# Patient Record
Sex: Male | Born: 1990 | Race: White | Hispanic: No | Marital: Single | Smoking: Never smoker
Health system: Southern US, Community
[De-identification: ages and names within clinical notes are randomized; demographics above are authoritative.]

## PROBLEM LIST (undated history)

## (undated) DIAGNOSIS — Z9889 Other specified postprocedural states: Secondary | ICD-10-CM

## (undated) DIAGNOSIS — R112 Nausea with vomiting, unspecified: Secondary | ICD-10-CM

## (undated) DIAGNOSIS — T300 Burn of unspecified body region, unspecified degree: Secondary | ICD-10-CM

## (undated) DIAGNOSIS — S42009A Fracture of unspecified part of unspecified clavicle, initial encounter for closed fracture: Secondary | ICD-10-CM

## (undated) DIAGNOSIS — S060XAA Concussion with loss of consciousness status unknown, initial encounter: Secondary | ICD-10-CM

## (undated) DIAGNOSIS — J189 Pneumonia, unspecified organism: Secondary | ICD-10-CM

## (undated) DIAGNOSIS — S060X9A Concussion with loss of consciousness of unspecified duration, initial encounter: Secondary | ICD-10-CM

## (undated) HISTORY — PX: WISDOM TOOTH EXTRACTION: SHX21

---

## 2014-06-25 ENCOUNTER — Ambulatory Visit (INDEPENDENT_AMBULATORY_CARE_PROVIDER_SITE_OTHER): Payer: Managed Care, Other (non HMO) | Admitting: Urgent Care

## 2014-06-25 VITALS — BP 96/60 | HR 52 | Temp 98.2°F | Resp 16 | Ht 69.0 in | Wt 132.2 lb

## 2014-06-25 DIAGNOSIS — J029 Acute pharyngitis, unspecified: Secondary | ICD-10-CM | POA: Diagnosis not present

## 2014-06-25 DIAGNOSIS — R0982 Postnasal drip: Secondary | ICD-10-CM | POA: Diagnosis not present

## 2014-06-25 LAB — POCT CBC
Granulocyte percent: 58.1 %G (ref 37–80)
HEMATOCRIT: 42.8 % — AB (ref 43.5–53.7)
HEMOGLOBIN: 14.5 g/dL (ref 14.1–18.1)
LYMPH, POC: 1.7 (ref 0.6–3.4)
MCH, POC: 31.4 pg — AB (ref 27–31.2)
MCHC: 33.9 g/dL (ref 31.8–35.4)
MCV: 92.7 fL (ref 80–97)
MID (cbc): 0.4 (ref 0–0.9)
MPV: 7.8 fL (ref 0–99.8)
POC Granulocyte: 3 (ref 2–6.9)
POC LYMPH PERCENT: 34.3 %L (ref 10–50)
POC MID %: 7.6 % (ref 0–12)
Platelet Count, POC: 191 10*3/uL (ref 142–424)
RBC: 4.62 M/uL — AB (ref 4.69–6.13)
RDW, POC: 14.6 %
WBC: 5.1 10*3/uL (ref 4.6–10.2)

## 2014-06-25 LAB — POCT RAPID STREP A (OFFICE): Rapid Strep A Screen: NEGATIVE

## 2014-06-25 NOTE — Progress Notes (Signed)
    MRN: [id number] DOB: 02/20/1991  Subjective:   Riley Hammond is a 24 y.o. male presenting for chief complaint of Sore Throat; sneezing; and Fatigue  Reports 1 day history of sore throat without cough, sneezing, left ear pressure. Denies fevers, itchy or watery red eyes, sinus pain, rhinorrhea, ear pain, ear drainage, chest pain, shob, wheezing, abdominal pain, n/v, diarrhea, myalgia. Also denies night sweats, weight loss, decreased appetite. Has had seasonal allergies in the past but has not needed to take any medications for this recently, denies history of asthma. Denies any other aggravating or relieving factors, no other questions or concerns.  Lyn Hollingsheadlexander has a current medication list which includes the following prescription(s): iron. He has No Known Allergies.  Lyn Hollingsheadlexander  has no past medical history on file. Also  has no past surgical history on file.  ROS As in subjective.  Objective:   Vitals: BP 96/60 mmHg  Pulse 52  Temp(Src) 98.2 F (36.8 C) (Oral)  Resp 16  Ht 5\' 9"  (1.753 m)  Wt 132 lb 3.2 oz (59.966 kg)  BMI 19.51 kg/m2  SpO2 100%  Physical Exam  Constitutional: He is well-developed, well-nourished, and in no distress.  HENT:  TM's intact bilaterally, no effusions or erythema. Nasal turbinates pink and moist with clear-yellow rhinorrhea. No sinus tenderness. Postnasal drip present, without oropharyngeal exudates, erythema or abscesses.  Eyes: Conjunctivae are normal. Right eye exhibits no discharge. Left eye exhibits no discharge. No scleral icterus.  Cardiovascular: Normal rate, regular rhythm and intact distal pulses.  Exam reveals no gallop and no friction rub.   No murmur heard. Pulmonary/Chest: No stridor. No respiratory distress. He has no wheezes. He has no rales. He exhibits no tenderness.  Lymphadenopathy:    He has cervical adenopathy (~1cm left sided anterior cervical node).   Results for orders placed or performed in visit on 06/25/14 (from  the past 24 hour(s))  POCT CBC     Status: Abnormal   Collection Time: 06/25/14  9:21 AM  Result Value Ref Range   WBC 5.1 4.6 - 10.2 K/uL   Lymph, poc 1.7 0.6 - 3.4   POC LYMPH PERCENT 34.3 10 - 50 %L   MID (cbc) 0.4 0 - 0.9   POC MID % 7.6 0 - 12 %M   POC Granulocyte 3.0 2 - 6.9   Granulocyte percent 58.1 37 - 80 %G   RBC 4.62 (A) 4.69 - 6.13 M/uL   Hemoglobin 14.5 14.1 - 18.1 g/dL   HCT, POC 13.042.8 (A) 86.543.5 - 53.7 %   MCV 92.7 80 - 97 fL   MCH, POC 31.4 (A) 27 - 31.2 pg   MCHC 33.9 31.8 - 35.4 g/dL   RDW, POC 78.414.6 %   Platelet Count, POC 191 142 - 424 K/uL   MPV 7.8 0 - 99.8 fL  POCT rapid strep A     Status: None   Collection Time: 06/25/14  9:23 AM  Result Value Ref Range   Rapid Strep A Screen Negative Negative   Assessment and Plan :   1. Sore throat 2. Postnasal drip - Labs pending, unlikely due to infectious process given physical exam findings and cbc. May be due to seasonal allergies, advise he restart Flonase, start Zyrtec, take Delsym over the counter, continue to hydrate well. - Return to clinic in 1 if symptoms have not resolved or have worsened.  Wallis BambergMario Teesha Ohm, PA-C [facility] [phone] 06/25/2014 8:58 AM

## 2014-06-27 LAB — CULTURE, GROUP A STREP: ORGANISM ID, BACTERIA: NORMAL

## 2015-05-09 ENCOUNTER — Encounter (HOSPITAL_COMMUNITY): Payer: Self-pay

## 2015-05-09 ENCOUNTER — Emergency Department (HOSPITAL_COMMUNITY)
Admission: EM | Admit: 2015-05-09 | Discharge: 2015-05-09 | Disposition: A | Discharge status: Home / Self Care | Payer: Managed Care, Other (non HMO) | Attending: Emergency Medicine | Admitting: Emergency Medicine

## 2015-05-09 DIAGNOSIS — Y999 Unspecified external cause status: Secondary | ICD-10-CM | POA: Insufficient documentation

## 2015-05-09 DIAGNOSIS — S99911A Unspecified injury of right ankle, initial encounter: Secondary | ICD-10-CM | POA: Diagnosis present

## 2015-05-09 DIAGNOSIS — Y92410 Unspecified street and highway as the place of occurrence of the external cause: Secondary | ICD-10-CM | POA: Diagnosis not present

## 2015-05-09 DIAGNOSIS — S91011A Laceration without foreign body, right ankle, initial encounter: Secondary | ICD-10-CM | POA: Diagnosis not present

## 2015-05-09 DIAGNOSIS — Y9389 Activity, other specified: Secondary | ICD-10-CM | POA: Insufficient documentation

## 2015-05-09 DIAGNOSIS — T148 Other injury of unspecified body region: Secondary | ICD-10-CM | POA: Insufficient documentation
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
## 2015-05-12 ENCOUNTER — Ambulatory Visit (INDEPENDENT_AMBULATORY_CARE_PROVIDER_SITE_OTHER): Payer: Self-pay

## 2015-05-12 ENCOUNTER — Ambulatory Visit (INDEPENDENT_AMBULATORY_CARE_PROVIDER_SITE_OTHER): Payer: Self-pay | Admitting: Physician Assistant

## 2015-05-12 VITALS — BP 110/62 | HR 50 | Temp 98.5°F | Resp 16 | Ht 68.5 in | Wt 144.2 lb

## 2015-05-12 DIAGNOSIS — T148XXA Other injury of unspecified body region, initial encounter: Secondary | ICD-10-CM

## 2015-05-12 DIAGNOSIS — S42022A Displaced fracture of shaft of left clavicle, initial encounter for closed fracture: Secondary | ICD-10-CM

## 2015-05-12 DIAGNOSIS — M25512 Pain in left shoulder: Secondary | ICD-10-CM

## 2015-05-12 MED ORDER — SILVER SULFADIAZINE 1 % EX CREA: 1.0000 "application " | TOPICAL_CREAM | Freq: Every day | CUTANEOUS | Status: AC
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
## 2015-05-12 NOTE — Patient Instructions (Addendum)
Because you received an x-ray today, you will receive an invoice from [facility]. Please contact [facility] at [phone] with questions or concerns regarding your invoice. Our billing staff will not be able to assist you with those questions. Please place the silvadene changes daily.   I would like you to await contact for [facility].

## 2015-05-14 ENCOUNTER — Other Ambulatory Visit: Payer: Self-pay | Admitting: Orthopedic Surgery

## 2015-05-18 ENCOUNTER — Encounter (HOSPITAL_COMMUNITY)
Admission: RE | Admit: 2015-05-18 | Discharge: 2015-05-18 | Disposition: A | Discharge status: Home / Self Care | Payer: Managed Care, Other (non HMO) | Source: Ambulatory Visit | Attending: Orthopedic Surgery | Admitting: Orthopedic Surgery

## 2015-05-18 ENCOUNTER — Encounter (HOSPITAL_COMMUNITY): Payer: Self-pay

## 2015-05-18 ENCOUNTER — Inpatient Hospital Stay (HOSPITAL_COMMUNITY): Admission: RE | Admit: 2015-05-18 | Payer: Self-pay | Source: Ambulatory Visit

## 2015-05-18 DIAGNOSIS — S4382XA Sprain of other specified parts of left shoulder girdle, initial encounter: Secondary | ICD-10-CM | POA: Diagnosis not present

## 2015-05-18 DIAGNOSIS — S42002A Fracture of unspecified part of left clavicle, initial encounter for closed fracture: Secondary | ICD-10-CM | POA: Insufficient documentation

## 2015-05-18 DIAGNOSIS — X58XXXA Exposure to other specified factors, initial encounter: Secondary | ICD-10-CM | POA: Insufficient documentation

## 2015-05-18 DIAGNOSIS — Z01812 Encounter for preprocedural laboratory examination: Secondary | ICD-10-CM | POA: Diagnosis not present

## 2015-05-18 HISTORY — DX: Fracture of unspecified part of unspecified clavicle, initial encounter for closed fracture: S42.009A

## 2015-05-18 HISTORY — DX: Concussion with loss of consciousness status unknown, initial encounter: S06.0XAA

## 2015-05-18 HISTORY — DX: Nausea with vomiting, unspecified: R11.2

## 2015-05-18 HISTORY — DX: Concussion with loss of consciousness of unspecified duration, initial encounter: S06.0X9A

## 2015-05-18 HISTORY — DX: Pneumonia, unspecified organism: J18.9

## 2015-05-18 HISTORY — DX: Burn of unspecified body region, unspecified degree: T30.0

## 2015-05-18 HISTORY — DX: Other specified postprocedural states: Z98.890

## 2015-05-18 LAB — CBC
HEMATOCRIT: 39.3 % (ref 39.0–52.0)
HEMOGLOBIN: 13.3 g/dL (ref 13.0–17.0)
MCH: 32 pg (ref 26.0–34.0)
MCHC: 33.8 g/dL (ref 30.0–36.0)
MCV: 94.5 fL (ref 78.0–100.0)
Platelets: 215 10*3/uL (ref 150–400)
RBC: 4.16 MIL/uL — AB (ref 4.22–5.81)
RDW: 14.3 % (ref 11.5–15.5)
WBC: 7 10*3/uL (ref 4.0–10.5)

## 2015-05-18 MED ORDER — CEFAZOLIN SODIUM-DEXTROSE 2-3 GM-% IV SOLR
2.0000 g | INTRAVENOUS | Status: AC
  Administered 2015-05-19: 2 g via INTRAVENOUS
  Filled 2015-05-18: qty 50

## 2015-05-18 NOTE — Progress Notes (Signed)
Pt denies SOB, chest pain, and being under the care of a cardiologist. Pt denies having a stress test, echo and cardiac cath. Pt denies having an EKG within the last year. 
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
## 2015-05-18 NOTE — Pre-Procedure Instructions (Signed)
Lyn Hollingshead [facility]  05/18/2015      [facility] DRUG STORE [address] [address], [place] - [address] AT [facility] OF [facility] & [address] [place] [address] Phone: [phone] Fax: [phone]  [facility] DR[facility] STORE [address] [address], [place] - [address] AT [facility] OF [address] & [address] [place] [address] Phone: [phone] Fax: [phone]    Your procedure is scheduled on Tuesday, May 19, 2015  Report to [facility] Admitting at 10:00 A.M.  Call this number if you have problems the morning of surgery:  [phone]   Remember:  Do not eat food or drink liquids after midnight.  Take these medicines the morning of surgery with A SIP OF WATER : None Stop taking Aspirin,vitamins, fish oil, and herbal medications. Do not take any NSAIDs ie: Ibuprofen, Advil, Naproxen, BC and Goody Powder or any medication containing Aspirin; stop now.  Do not wear jewelry, make-up or nail polish.  Do not wear lotions, powders, or perfumes.  You may not wear deodorant.  Do not shave 48 hours prior to surgery.  Men may shave face and neck.  Do not bring valuables to the hospital.  [facility] is not responsible for any belongings or valuables.  Contacts, dentures or bridgework may not be worn into surgery.  Leave your suitcase in the car.  After surgery it may be brought to your room.  For patients admitted to the hospital, discharge time will be determined by your treatment team.  Patients discharged the day of surgery will not be allowed to drive home.   Name and phone number of your driver:   Special instructions:  [facility] - Preparing for Surgery  Before surgery, you can play an important role.  Because skin is not sterile, your skin needs to be as free of germs as possible.  You can reduce the number of germs on you skin by washing with CHG (chlorahexidine gluconate) soap before surgery.  CHG is an antiseptic  cleaner which kills germs and bonds with the skin to continue killing germs even after washing.  Please DO NOT use if you have an allergy to CHG or antibacterial soaps.  If your skin becomes reddened/irritated stop using the CHG and inform your nurse when you arrive at Short Stay.  Do not shave (including legs and underarms) for at least 48 hours prior to the first CHG shower.  You may shave your face.  Please follow these instructions carefully:   1.  Shower with CHG Soap the night before surgery and the morning of Surgery.  2.  If you choose to wash your hair, wash your hair first as usual with your normal shampoo.  3.  After you shampoo, rinse your hair and body thoroughly to remove the Shampoo.  4.  Use CHG as you would any other liquid soap.  You can apply chg directly  to the skin and wash gently with scrungie or a clean washcloth.  5.  Apply the CHG Soap to your body ONLY FROM THE NECK DOWN.  Do not use on open wounds or open sores.  Avoid contact with your eyes, ears, mouth and genitals (private parts).  Wash genitals (private parts) with your normal soap.  6.  Wash thoroughly, paying special attention to the area where your surgery will be performed.  7.  Thoroughly rinse your body with warm water from the neck down.  8.  DO NOT shower/wash with your normal soap after using and rinsing off the CHG Soap.  9.  Pat yourself dry with a clean towel.            10.  Wear clean pajamas.            11.  Place clean sheets on your bed the night of your first shower and do not sleep with pets.  Day of Surgery  Do not apply any lotions/deodorants the morning of surgery.  Please wear clean clothes to the hospital/surgery center.  Please read over the following fact sheets that you were given. Pain Booklet, Coughing and Deep Breathing and Surgical Site Infection Prevention

## 2015-05-19 ENCOUNTER — Encounter (HOSPITAL_COMMUNITY): Payer: Self-pay | Admitting: Anesthesiology

## 2015-05-19 ENCOUNTER — Encounter (HOSPITAL_COMMUNITY)
Admission: RE | Disposition: A | Discharge status: Home / Self Care | Payer: Self-pay | Source: Ambulatory Visit | Attending: Orthopedic Surgery

## 2015-05-19 ENCOUNTER — Ambulatory Visit (HOSPITAL_COMMUNITY): Payer: Managed Care, Other (non HMO) | Admitting: Anesthesiology

## 2015-05-19 ENCOUNTER — Ambulatory Visit (HOSPITAL_COMMUNITY): Payer: Managed Care, Other (non HMO)

## 2015-05-19 ENCOUNTER — Ambulatory Visit (HOSPITAL_COMMUNITY)
Admission: RE | Admit: 2015-05-19 | Discharge: 2015-05-19 | Disposition: A | Discharge status: Home / Self Care | Payer: Managed Care, Other (non HMO) | Source: Ambulatory Visit | Attending: Orthopedic Surgery | Admitting: Orthopedic Surgery

## 2015-05-19 DIAGNOSIS — X58XXXA Exposure to other specified factors, initial encounter: Secondary | ICD-10-CM | POA: Diagnosis not present

## 2015-05-19 DIAGNOSIS — Z419 Encounter for procedure for purposes other than remedying health state, unspecified: Secondary | ICD-10-CM

## 2015-05-19 DIAGNOSIS — S42002A Fracture of unspecified part of left clavicle, initial encounter for closed fracture: Secondary | ICD-10-CM | POA: Diagnosis not present

## 2015-05-19 DIAGNOSIS — S4382XA Sprain of other specified parts of left shoulder girdle, initial encounter: Secondary | ICD-10-CM | POA: Insufficient documentation

## 2015-05-19 DIAGNOSIS — S80812A Abrasion, left lower leg, initial encounter: Secondary | ICD-10-CM | POA: Insufficient documentation

## 2015-05-19 DIAGNOSIS — S80811A Abrasion, right lower leg, initial encounter: Secondary | ICD-10-CM | POA: Diagnosis not present

## 2015-05-19 HISTORY — PX: ORIF CLAVICULAR FRACTURE: SHX5055

## 2015-05-19 SURGERY — OPEN REDUCTION INTERNAL FIXATION (ORIF) CLAVICULAR FRACTURE
Anesthesia: Regional | Site: Shoulder | Laterality: Left

## 2015-05-19 MED ORDER — GLYCOPYRROLATE 0.2 MG/ML IJ SOLN
INTRAMUSCULAR | Status: AC
  Filled 2015-05-19: qty 4

## 2015-05-19 MED ORDER — NEOSTIGMINE METHYLSULFATE 10 MG/10ML IV SOLN
INTRAVENOUS | Status: AC
  Filled 2015-05-19: qty 1

## 2015-05-19 MED ORDER — EPHEDRINE SULFATE 50 MG/ML IJ SOLN
INTRAMUSCULAR | Status: DC | PRN
  Administered 2015-05-19: 10 mg via INTRAVENOUS
  Administered 2015-05-19 (×3): 5 mg via INTRAVENOUS

## 2015-05-19 MED ORDER — PROPOFOL 10 MG/ML IV BOLUS
INTRAVENOUS | Status: AC
  Filled 2015-05-19: qty 20

## 2015-05-19 MED ORDER — MIDAZOLAM HCL 5 MG/ML IJ SOLN: 2.0000 mg | Freq: Once | INTRAMUSCULAR | Status: DC

## 2015-05-19 MED ORDER — PROPOFOL 10 MG/ML IV BOLUS
INTRAVENOUS | Status: DC | PRN
  Administered 2015-05-19: 180 mg via INTRAVENOUS

## 2015-05-19 MED ORDER — ROCURONIUM BROMIDE 50 MG/5ML IV SOLN
INTRAVENOUS | Status: AC
  Filled 2015-05-19: qty 2

## 2015-05-19 MED ORDER — METOCLOPRAMIDE HCL 5 MG/ML IJ SOLN: 10.0000 mg | Freq: Once | INTRAMUSCULAR | Status: DC | PRN

## 2015-05-19 MED ORDER — CHLORHEXIDINE GLUCONATE 4 % EX LIQD: 60.0000 mL | Freq: Once | CUTANEOUS | Status: DC

## 2015-05-19 MED ORDER — LIDOCAINE HCL (CARDIAC) 20 MG/ML IV SOLN
INTRAVENOUS | Status: DC | PRN
  Administered 2015-05-19: 60 mg via INTRAVENOUS

## 2015-05-19 MED ORDER — BUPIVACAINE HCL (PF) 0.25 % IJ SOLN
INTRAMUSCULAR | Status: AC
  Filled 2015-05-19: qty 30

## 2015-05-19 MED ORDER — LACTATED RINGERS IV SOLN
INTRAVENOUS | Status: DC
  Administered 2015-05-19 (×2): via INTRAVENOUS

## 2015-05-19 MED ORDER — ONDANSETRON HCL 4 MG/2ML IJ SOLN
INTRAMUSCULAR | Status: DC | PRN
  Administered 2015-05-19: 4 mg via INTRAVENOUS

## 2015-05-19 MED ORDER — MIDAZOLAM HCL 2 MG/2ML IJ SOLN
INTRAMUSCULAR | Status: AC
  Filled 2015-05-19: qty 2

## 2015-05-19 MED ORDER — SUGAMMADEX SODIUM 200 MG/2ML IV SOLN
INTRAVENOUS | Status: DC | PRN
  Administered 2015-05-19: 134.2 mg via INTRAVENOUS

## 2015-05-19 MED ORDER — BUPIVACAINE-EPINEPHRINE (PF) 0.5% -1:200000 IJ SOLN
INTRAMUSCULAR | Status: DC | PRN
  Administered 2015-05-19: 30 mL via PERINEURAL

## 2015-05-19 MED ORDER — EPHEDRINE SULFATE 50 MG/ML IJ SOLN
INTRAMUSCULAR | Status: AC
  Filled 2015-05-19: qty 1

## 2015-05-19 MED ORDER — HYDROMORPHONE HCL 1 MG/ML IJ SOLN: 0.2500 mg | INTRAMUSCULAR | Status: DC | PRN

## 2015-05-19 MED ORDER — METHOCARBAMOL 500 MG PO TABS: 500.0000 mg | ORAL_TABLET | Freq: Three times a day (TID) | ORAL | Status: AC | PRN

## 2015-05-19 MED ORDER — SUGAMMADEX SODIUM 200 MG/2ML IV SOLN
INTRAVENOUS | Status: AC
  Filled 2015-05-19: qty 2

## 2015-05-19 MED ORDER — ONDANSETRON HCL 4 MG/2ML IJ SOLN
INTRAMUSCULAR | Status: AC
  Filled 2015-05-19: qty 2

## 2015-05-19 MED ORDER — ROCURONIUM BROMIDE 100 MG/10ML IV SOLN
INTRAVENOUS | Status: DC | PRN
  Administered 2015-05-19: 10 mg via INTRAVENOUS
  Administered 2015-05-19: 40 mg via INTRAVENOUS
  Administered 2015-05-19: 10 mg via INTRAVENOUS

## 2015-05-19 MED ORDER — OXYCODONE-ACETAMINOPHEN 5-325 MG PO TABS: 1.0000 | ORAL_TABLET | Freq: Four times a day (QID) | ORAL | Status: AC | PRN

## 2015-05-19 MED ORDER — SUCCINYLCHOLINE CHLORIDE 20 MG/ML IJ SOLN
INTRAMUSCULAR | Status: AC
  Filled 2015-05-19: qty 1

## 2015-05-19 MED ORDER — MIDAZOLAM HCL 2 MG/2ML IJ SOLN
INTRAMUSCULAR | Status: AC
  Administered 2015-05-19: 2 mg
  Filled 2015-05-19: qty 2

## 2015-05-19 MED ORDER — FENTANYL CITRATE (PF) 100 MCG/2ML IJ SOLN
INTRAMUSCULAR | Status: DC | PRN
Start: 2015-05-19 — End: 2015-05-19
  Administered 2015-05-19 (×2): 50 ug via INTRAVENOUS

## 2015-05-19 MED ORDER — FENTANYL CITRATE (PF) 250 MCG/5ML IJ SOLN
INTRAMUSCULAR | Status: AC
  Filled 2015-05-19: qty 5

## 2015-05-19 MED ORDER — MEPERIDINE HCL 25 MG/ML IJ SOLN: 6.2500 mg | INTRAMUSCULAR | Status: DC | PRN

## 2015-05-19 MED ORDER — FENTANYL CITRATE (PF) 100 MCG/2ML IJ SOLN
INTRAMUSCULAR | Status: AC
  Administered 2015-05-19: 100 ug
  Filled 2015-05-19: qty 2

## 2015-05-19 MED ORDER — 0.9 % SODIUM CHLORIDE (POUR BTL) OPTIME
TOPICAL | Status: DC | PRN
  Administered 2015-05-19 (×3): 1000 mL

## 2015-05-19 MED ORDER — FENTANYL CITRATE (PF) 100 MCG/2ML IJ SOLN: 100.0000 ug | Freq: Once | INTRAMUSCULAR | Status: DC

## 2015-05-19 MED ORDER — ARTIFICIAL TEARS OP OINT
TOPICAL_OINTMENT | OPHTHALMIC | Status: AC
  Filled 2015-05-19: qty 3.5

## 2015-05-19 MED ORDER — LIDOCAINE HCL (CARDIAC) 20 MG/ML IV SOLN
INTRAVENOUS | Status: AC
  Filled 2015-05-19: qty 5

## 2015-05-19 SURGICAL SUPPLY — 65 items
BENZOIN TINCTURE PRP APPL 2/3 (GAUZE/BANDAGES/DRESSINGS) ×3 IMPLANT
BIT DRILL 2 CANN GRADUATED (BIT) ×3 IMPLANT
BIT DRILL 2.5 CANN ENDOSCOPIC (BIT) ×3 IMPLANT
BIT DRILL CANNULTD 2.4MMAC REP (DRILL) ×1 IMPLANT
BUTTON CLAVICLE (Orthopedic Implant) ×2 IMPLANT
BUTTON DOGBONE (Orthopedic Implant) ×3 IMPLANT
CLOSURE STERI-STRIP 1/2X4 (GAUZE/BANDAGES/DRESSINGS) ×1
CLSR STERI-STRIP ANTIMIC 1/2X4 (GAUZE/BANDAGES/DRESSINGS) ×2 IMPLANT
COVER SURGICAL LIGHT HANDLE (MISCELLANEOUS) ×3 IMPLANT
DRAPE C-ARM 42X72 X-RAY (DRAPES) ×3 IMPLANT
DRAPE IMP U-DRAPE 54X76 (DRAPES) ×3 IMPLANT
DRAPE INCISE IOBAN 66X45 STRL (DRAPES) ×6 IMPLANT
DRAPE U-SHAPE 47X51 STRL (DRAPES) ×6 IMPLANT
DRILL CANNULATED 2.4MM AC REP (DRILL) ×3
DRSG AQUACEL AG ADV 3.5X10 (GAUZE/BANDAGES/DRESSINGS) ×3 IMPLANT
DURAPREP 26ML APPLICATOR (WOUND CARE) ×3 IMPLANT
ELECT REM PT RETURN 9FT ADLT (ELECTROSURGICAL) ×3
ELECTRODE REM PT RTRN 9FT ADLT (ELECTROSURGICAL) ×1 IMPLANT
FIBERSTICK 2 (SUTURE) ×3 IMPLANT
GLOVE BIO SURGEON ST LM GN SZ9 (GLOVE) ×3 IMPLANT
GLOVE BIOGEL PI IND STRL 6.5 (GLOVE) ×1 IMPLANT
GLOVE BIOGEL PI IND STRL 8 (GLOVE) ×1 IMPLANT
GLOVE BIOGEL PI INDICATOR 6.5 (GLOVE) ×2
GLOVE BIOGEL PI INDICATOR 8 (GLOVE) ×2
GLOVE SURG ORTHO 8.0 STRL STRW (GLOVE) ×6 IMPLANT
GLOVE SURG SS PI 6.5 STRL IVOR (GLOVE) ×9 IMPLANT
GOWN STRL REUS W/ TWL LRG LVL3 (GOWN DISPOSABLE) ×2 IMPLANT
GOWN STRL REUS W/ TWL XL LVL3 (GOWN DISPOSABLE) ×1 IMPLANT
GOWN STRL REUS W/TWL LRG LVL3 (GOWN DISPOSABLE) ×4
GOWN STRL REUS W/TWL XL LVL3 (GOWN DISPOSABLE) ×2
K-WIRE BB-TAK (WIRE) ×3
KIT BASIN OR (CUSTOM PROCEDURE TRAY) ×3 IMPLANT
KIT ROOM TURNOVER OR (KITS) ×3 IMPLANT
KWIRE BB-TAK (WIRE) ×1 IMPLANT
LASSO CRESCENT QUICKPASS (SUTURE) ×3 IMPLANT
LOOP FIBERTAPE 2MM BLUE (VASCULAR PRODUCTS) ×3 IMPLANT
MANIFOLD NEPTUNE II (INSTRUMENTS) ×3 IMPLANT
NEEDLE 1/2 CIR CATGUT .05X1.09 (NEEDLE) ×3 IMPLANT
NEEDLE HYPO 25GX1X1/2 BEV (NEEDLE) ×9 IMPLANT
NS IRRIG 1000ML POUR BTL (IV SOLUTION) ×9 IMPLANT
PACK SHOULDER (CUSTOM PROCEDURE TRAY) ×3 IMPLANT
PACK UNIVERSAL I (CUSTOM PROCEDURE TRAY) ×3 IMPLANT
PAD ARMBOARD 7.5X6 YLW CONV (MISCELLANEOUS) ×3 IMPLANT
PLATE 5H DISTAL THIRD (Plate) ×3 IMPLANT
RETRIEVER SUT HEWSON (MISCELLANEOUS) ×3 IMPLANT
SCREW LOCK T10 FT 18X2.7X (Screw) ×1 IMPLANT
SCREW LOCKING 2.7X16MM (Screw) ×6 IMPLANT
SCREW LOCKING 2.7X18MM (Screw) ×2 IMPLANT
SCREW LOCKING LP 2.7X20MM (Screw) ×6 IMPLANT
SCREW LOW PROFILE 3.5X16 (Screw) ×3 IMPLANT
SCREW NLOCK 26X2.7XLOPRFL (Screw) ×1 IMPLANT
SCREW NLOCK T15 FT 18X3.5XST (Screw) ×2 IMPLANT
SCREW NON LOCK 3.5X18MM (Screw) ×4 IMPLANT
SCREW NON-LOCKING 3.5X20 ANKLE (Screw) ×3 IMPLANT
SCREW NONLOCK 2.7X26MM (Screw) ×2 IMPLANT
SPONGE LAP 4X18 X RAY DECT (DISPOSABLE) ×15 IMPLANT
SUCTION FRAZIER HANDLE 10FR (MISCELLANEOUS) ×2
SUCTION TUBE FRAZIER 10FR DISP (MISCELLANEOUS) ×1 IMPLANT
SUT ETHILON 3 0 PS 1 (SUTURE) ×3 IMPLANT
SUT VIC AB 0 CT1 27 (SUTURE) ×4
SUT VIC AB 0 CT1 27XBRD ANBCTR (SUTURE) ×2 IMPLANT
SUT VIC AB 1 CT1 27 (SUTURE) ×6
SUT VIC AB 1 CT1 27XBRD ANBCTR (SUTURE) ×3 IMPLANT
TOWEL OR 17X24 6PK STRL BLUE (TOWEL DISPOSABLE) ×3 IMPLANT
TOWEL OR 17X26 10 PK STRL BLUE (TOWEL DISPOSABLE) ×3 IMPLANT

## 2015-05-19 NOTE — Transfer of Care (Signed)
Immediate Anesthesia Transfer of Care Note  Patient: Riley Hammond  Procedure(s) Performed: Procedure(s): OPEN REDUCTION INTERNAL FIXATION (ORIF) LEFT CLAVICLE FRACTURE WITH CORACOLIGAMENT FIXATION (Left)  Patient Location: PACU  Anesthesia Type:GA combined with regional for post-op pain  Level of Consciousness: awake and alert   Airway & Oxygen Therapy: Patient Spontanous Breathing and Patient connected to nasal cannula oxygen  Post-op Assessment: Report given to RN and Post -op Vital signs reviewed and stable  Post vital signs: Reviewed and stable  Last Vitals:  Filed Vitals:   05/19/15 1143 05/19/15 1546  BP:  128/73  Pulse: 63 67  Temp:  36.6 C  Resp: 12 9    Complications: No apparent anesthesia complications

## 2015-05-19 NOTE — Interval H&P Note (Signed)
History and Physical Interval Note:  05/19/2015 11:55 AM  Riley Hammond  has presented today for surgery, with the diagnosis of left shoulder clavicle fracture and coracoclavicular ligament disruption  The various methods of treatment have been discussed with the patient and family. After consideration of risks, benefits and other options for treatment, the patient has consented to  Procedure(s): OPEN REDUCTION INTERNAL FIXATION (ORIF) LEFT CLAVICLE FRACTURE WITH CORACOLIGAMENT FIXATION (Left) as a surgical intervention .  The patient's history has been reviewed, patient examined, no change in status, stable for surgery.  I have reviewed the patient's chart and labs.  Questions were answered to the patient's satisfaction.     Frank Pilger SCOTT

## 2015-05-19 NOTE — Anesthesia Procedure Notes (Addendum)
Anesthesia Regional Block:  Interscalene brachial plexus block  Pre-Anesthetic Checklist: ,, timeout performed, Correct Patient, Correct Site, Correct Laterality, Correct Procedure, Correct Position, site marked, Risks and benefits discussed,  Surgical consent,  Pre-op evaluation,  At surgeon's request and post-op pain management  Laterality: Left  Prep: chloraprep       Needles:  Injection technique: Single-shot  Needle Type: Echogenic Stimulator Needle     Needle Length: 10cm 10 cm Needle Gauge: 21 and 21 G  Needle insertion depth: 4 cm   Additional Needles:  Procedures: ultrasound guided (picture in chart) Interscalene brachial plexus block Narrative:  Start time: 05/19/2015 11:20 AM Injection made incrementally with aspirations every 5 mL.  Performed by: Personally  Anesthesiologist: Mal Amabile  Additional Notes: Patient tolerated procedure well. Adequate sensory level.   Procedure Name: Intubation Date/Time: 05/19/2015 12:07 PM Performed by: Fransisca Kaufmann Pre-anesthesia Checklist: Patient identified, Emergency Drugs available, Suction available, Patient being monitored and Timeout performed Patient Re-evaluated:Patient Re-evaluated prior to inductionOxygen Delivery Method: Circle system utilized Preoxygenation: Pre-oxygenation with 100% oxygen Intubation Type: IV induction Ventilation: Mask ventilation without difficulty Laryngoscope Size: Miller and 2 Grade View: Grade I Tube type: Oral Tube size: 7.5 mm Number of attempts: 1 Airway Equipment and Method: Stylet Placement Confirmation: ETT inserted through vocal cords under direct vision,  positive ETCO2 and breath sounds checked- equal and bilateral Secured at: 22 cm Tube secured with: Tape Dental Injury: Teeth and Oropharynx as per pre-operative assessment

## 2015-05-19 NOTE — Brief Op Note (Signed)
05/19/2015  3:30 PM  PATIENT:  Riley Hammond  25 y.o. male  PRE-OPERATIVE DIAGNOSIS:  left shoulder clavicle fracture and coracoclavicular ligament disruption  POST-OPERATIVE DIAGNOSIS:  left shoulder clavicle fracture and coracoclavicular ligament disruption  PROCEDURE:  Procedure(s): OPEN REDUCTION INTERNAL FIXATION (ORIF) LEFT CLAVICLE FRACTURE WITH CORACOLIGAMENT FIXATION  SURGEON:  Surgeon(s): Cammy Copa, MD  ASSISTANT: Patrick Jupiter RNFA  ANESTHESIA:   regional and general  EBL: 25 ml    Total I/O In: 1300 [I.V.:1300] Out: -   BLOOD ADMINISTERED: none  DRAINS: none   LOCAL MEDICATIONS USED:  none  SPECIMEN:  No Specimen  COUNTS:  YES  TOURNIQUET:  * No tourniquets in log *  DICTATION: .Other Dictation: Dictation Number [id number]  PLAN OF CARE: Discharge to home after PACU  PATIENT DISPOSITION:  PACU - hemodynamically stable

## 2015-05-19 NOTE — Progress Notes (Signed)
Skin ok left shoulder Will proceed

## 2015-05-19 NOTE — Anesthesia Preprocedure Evaluation (Addendum)
Anesthesia Evaluation  Patient identified by MRN, date of birth, ID band Patient awake    Reviewed: Allergy & Precautions, H&P , NPO status , Patient's Chart, lab work & pertinent test results  History of Anesthesia Complications (+) PONV and history of anesthetic complications  Airway Mallampati: II  TM Distance: >3 FB Neck ROM: full    Dental no notable dental hx.    Pulmonary neg pulmonary ROS,    Pulmonary exam normal breath sounds clear to auscultation       Cardiovascular negative cardio ROS Normal cardiovascular exam Rhythm:regular Rate:Normal     Neuro/Psych negative neurological ROS     GI/Hepatic negative GI ROS, Neg liver ROS,   Endo/Other  negative endocrine ROS  Renal/GU negative Renal ROS     Musculoskeletal   Abdominal   Peds  Hematology negative hematology ROS (+)   Anesthesia Other Findings   Reproductive/Obstetrics negative OB ROS                             Anesthesia Physical Anesthesia Plan  ASA: I  Anesthesia Plan: General   Post-op Pain Management: GA combined w/ Regional for post-op pain   Induction: Intravenous  Airway Management Planned: Oral ETT  Additional Equipment:   Intra-op Plan:   Post-operative Plan: Extubation in OR  Informed Consent: I have reviewed the patients History and Physical, chart, labs and discussed the procedure including the risks, benefits and alternatives for the proposed anesthesia with the patient or authorized representative who has indicated his/her understanding and acceptance.   Dental Advisory Given and Dental advisory given  Plan Discussed with: Anesthesiologist, CRNA and Surgeon  Anesthesia Plan Comments:       Anesthesia Quick Evaluation

## 2015-05-19 NOTE — Anesthesia Postprocedure Evaluation (Signed)
Anesthesia Post Note  Patient: Riley Hammond  Procedure(s) Performed: Procedure(s) (LRB): OPEN REDUCTION INTERNAL FIXATION (ORIF) LEFT CLAVICLE FRACTURE WITH CORACOLIGAMENT FIXATION (Left)  Patient location during evaluation: PACU Anesthesia Type: General Level of consciousness: awake and alert and oriented Pain management: pain level controlled Vital Signs Assessment: post-procedure vital signs reviewed and stable Respiratory status: spontaneous breathing, nonlabored ventilation and respiratory function stable Cardiovascular status: blood pressure returned to baseline and stable Postop Assessment: no signs of nausea or vomiting Anesthetic complications: no Comments: Patient has good sensory block left shoulder.    Last Vitals:  Filed Vitals:   05/19/15 1615 05/19/15 1630  BP: 125/88 126/89  Pulse: 59 58  Temp:  36.7 C  Resp: 19 14    Last Pain:  Filed Vitals:   05/19/15 1632  PainSc: 0-No pain                 Latavia Goga A.

## 2015-05-20 ENCOUNTER — Encounter (HOSPITAL_COMMUNITY): Payer: Self-pay | Admitting: Orthopedic Surgery
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
## 2015-05-26 ENCOUNTER — Encounter (HOSPITAL_COMMUNITY): Payer: Self-pay | Admitting: Orthopedic Surgery
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
## 2017-06-27 IMAGING — RF DG CLAVICLE*L*
1 series · 1 of 1 positions shown · non-contrast
Comparison: None

FLUOROSCOPY TIME:  53 second

CLINICAL DATA: ORIF left clavicular fracture

EXAM:
DG C-ARM 61-120 MIN; LEFT CLAVICLE - 2+ VIEWS

[Series 1: run · 1 of 1 slices shown]
[im 1/1]
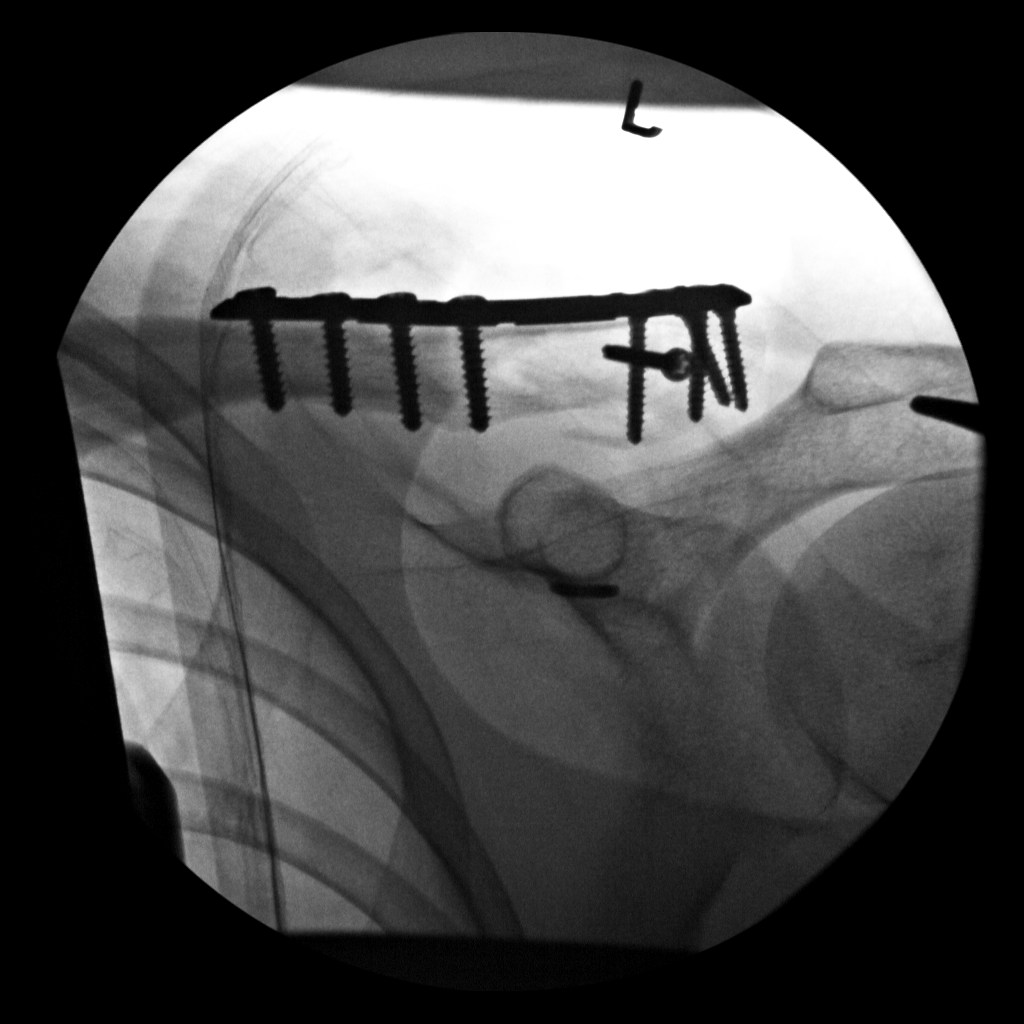

[1 of 1 positions shown; findings below may reference images not displayed]

FINDINGS: Left distal clavicular fracture transfixed with a sideplate and
multiple interlocking screws. Coracoclavicular ligament
reconstruction.
IMPRESSION: 1. ORIF left clavicular fracture.
